# Patient Record
Sex: Male | Born: 1975 | Hispanic: Yes | Marital: Single
Health system: Southern US, Community
[De-identification: ages and names within clinical notes are randomized; demographics above are authoritative.]

---

## 2004-12-25 ENCOUNTER — Emergency Department: Payer: Self-pay | Admitting: Emergency Medicine

## 2005-01-25 ENCOUNTER — Emergency Department: Payer: Self-pay | Admitting: Emergency Medicine

## 2005-11-27 ENCOUNTER — Other Ambulatory Visit: Payer: Self-pay

## 2005-11-27 ENCOUNTER — Emergency Department: Payer: Self-pay | Admitting: Emergency Medicine

## 2006-05-12 ENCOUNTER — Emergency Department: Payer: Self-pay

## 2006-05-22 ENCOUNTER — Emergency Department: Payer: Self-pay | Admitting: General Practice

## 2006-05-23 ENCOUNTER — Emergency Department: Payer: Self-pay | Admitting: Emergency Medicine

## 2006-05-24 ENCOUNTER — Emergency Department: Payer: Self-pay | Admitting: Internal Medicine

## 2006-05-26 ENCOUNTER — Emergency Department: Payer: Self-pay | Admitting: Emergency Medicine

## 2006-06-26 ENCOUNTER — Emergency Department: Payer: Self-pay | Admitting: Emergency Medicine

## 2007-02-13 IMAGING — CR LEFT INDEX FINGER 2+V
1 series · 3 of 3 positions shown · non-contrast
Comparison: none

REASON FOR EXAM: laceration
COMMENTS:

PROCEDURE:     DXR - DXR FINGER INDEX 2ND DIGIT LT HA  - December 25, 2004  [DATE]
RESULT:
Three views of the LEFT index finger show no fracture, dislocation or other
acute bony abnormality.

[Series 296: postero_anterior · 0.11mm/px · 3 of 3 slices shown]
[im 1/3]
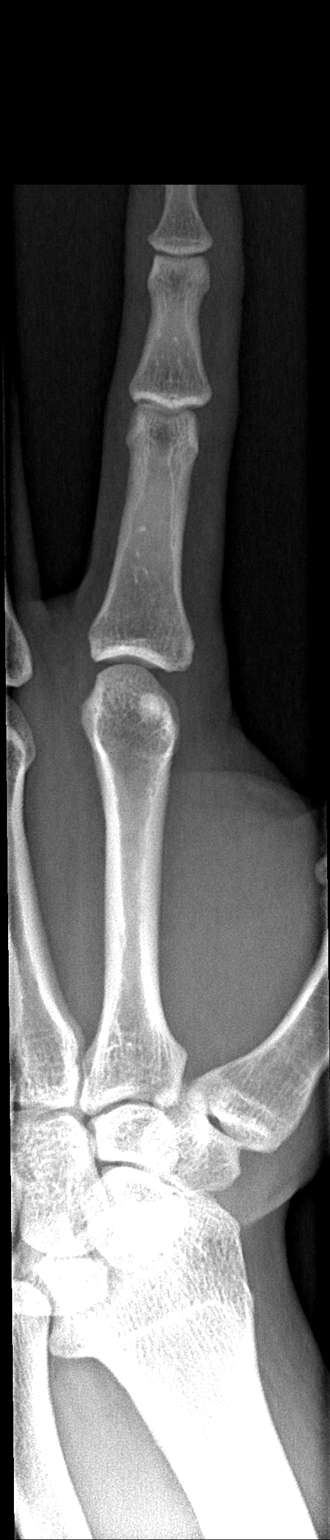
[im 2/3]
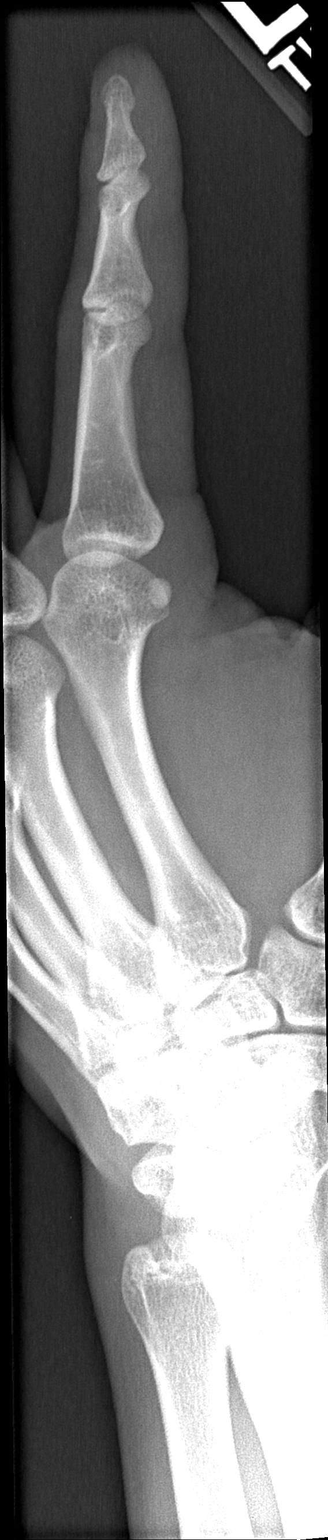
[im 3/3]
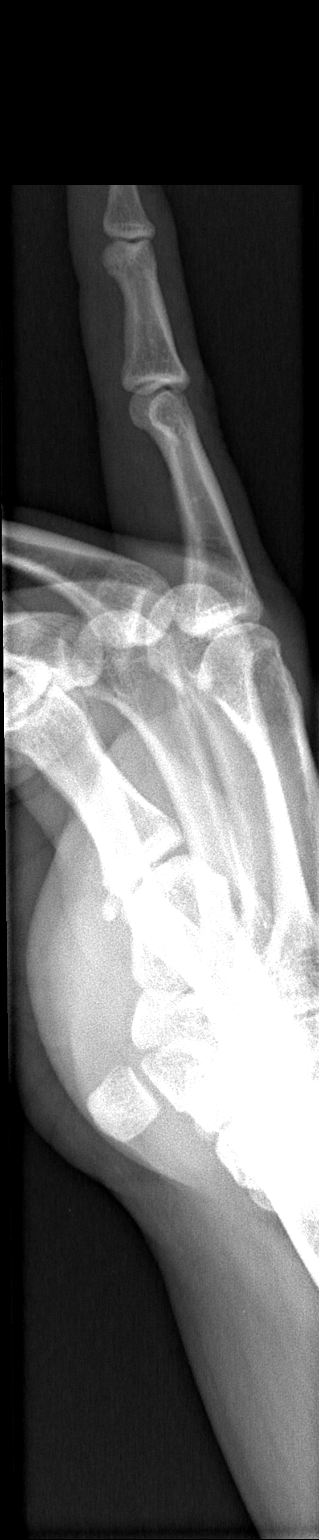

[3 of 3 positions shown; findings below may reference images not displayed]

IMPRESSION: 1)No acute bony abnormalities are seen.

2)No radiopaque soft tissue foreign body is observed.

## 2007-12-06 ENCOUNTER — Emergency Department: Payer: Self-pay | Admitting: Emergency Medicine

## 2007-12-06 ENCOUNTER — Other Ambulatory Visit: Payer: Self-pay

## 2008-05-20 ENCOUNTER — Emergency Department: Payer: Self-pay | Admitting: Emergency Medicine

## 2008-07-10 IMAGING — CR DG KNEE COMPLETE 4+V*R*
1 series · 4 of 4 positions shown · non-contrast
Comparison: none

REASON FOR EXAM: Pain, fever
COMMENTS:  LMP: (Male)

PROCEDURE:     DXR - DXR KNEE RT COMP WITH OBLIQUES  - May 22, 2006 [DATE]
RESULT:     No acute bony or joint abnormality is identified. No evidence of
fracture or dislocation.

[Series 1: view not recorded · 0.17mm/px · 4 of 4 slices shown]
[im 1/4]
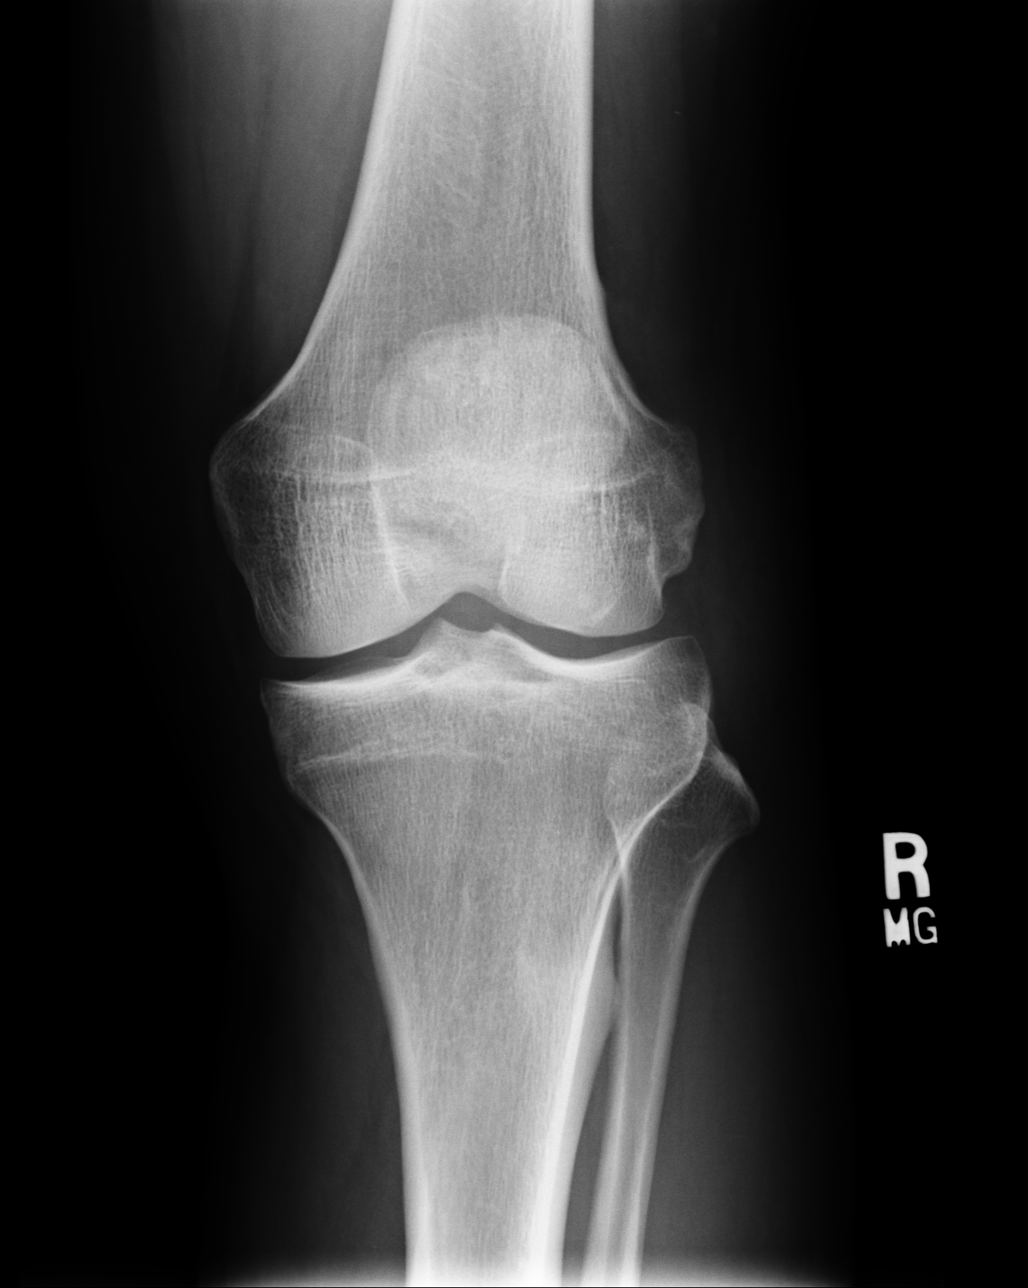
[im 2/4]
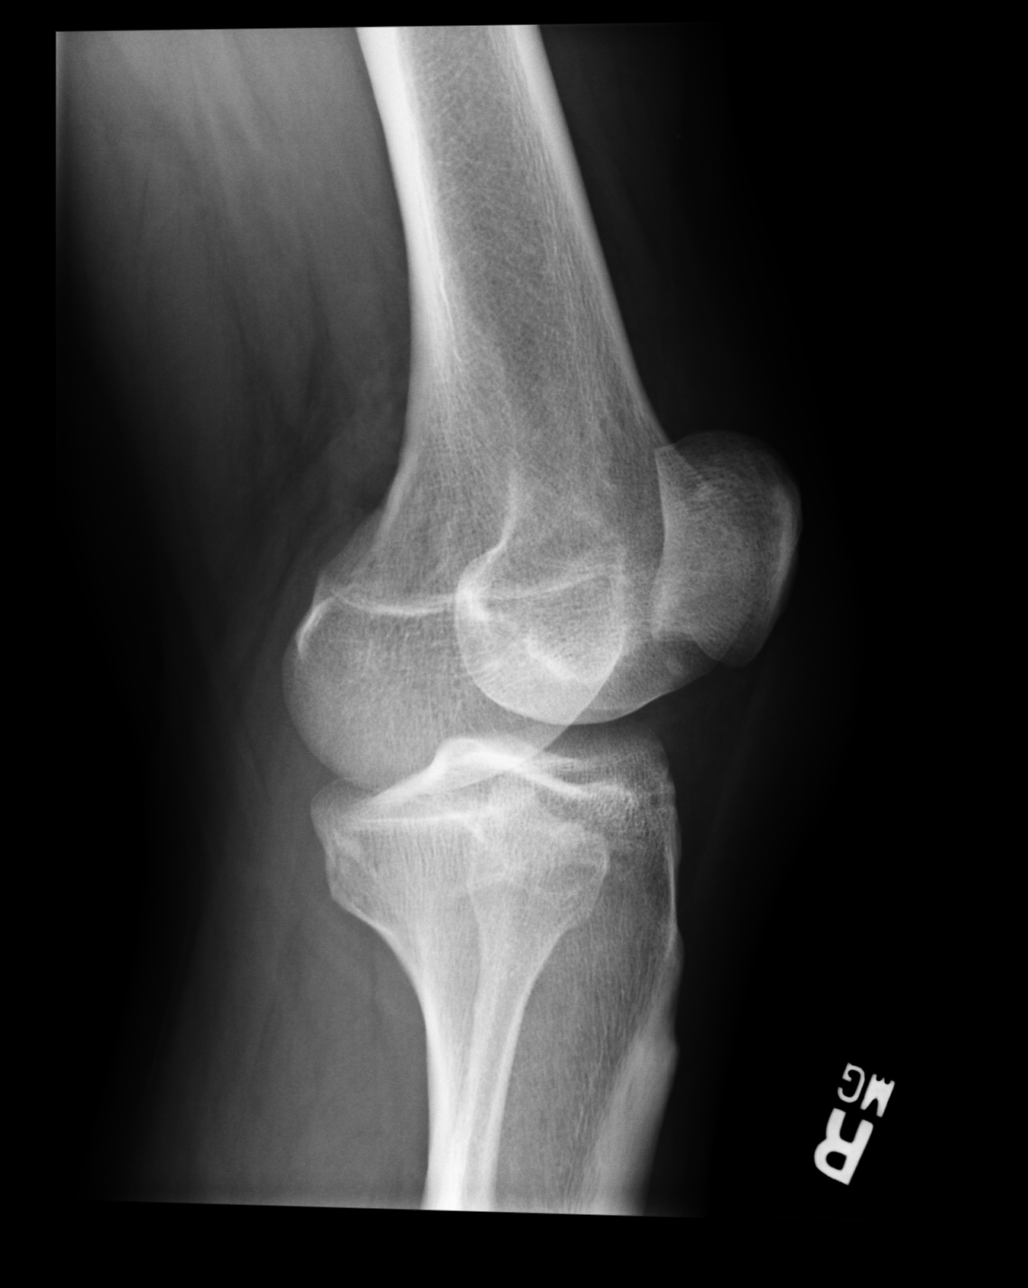
[im 3/4]
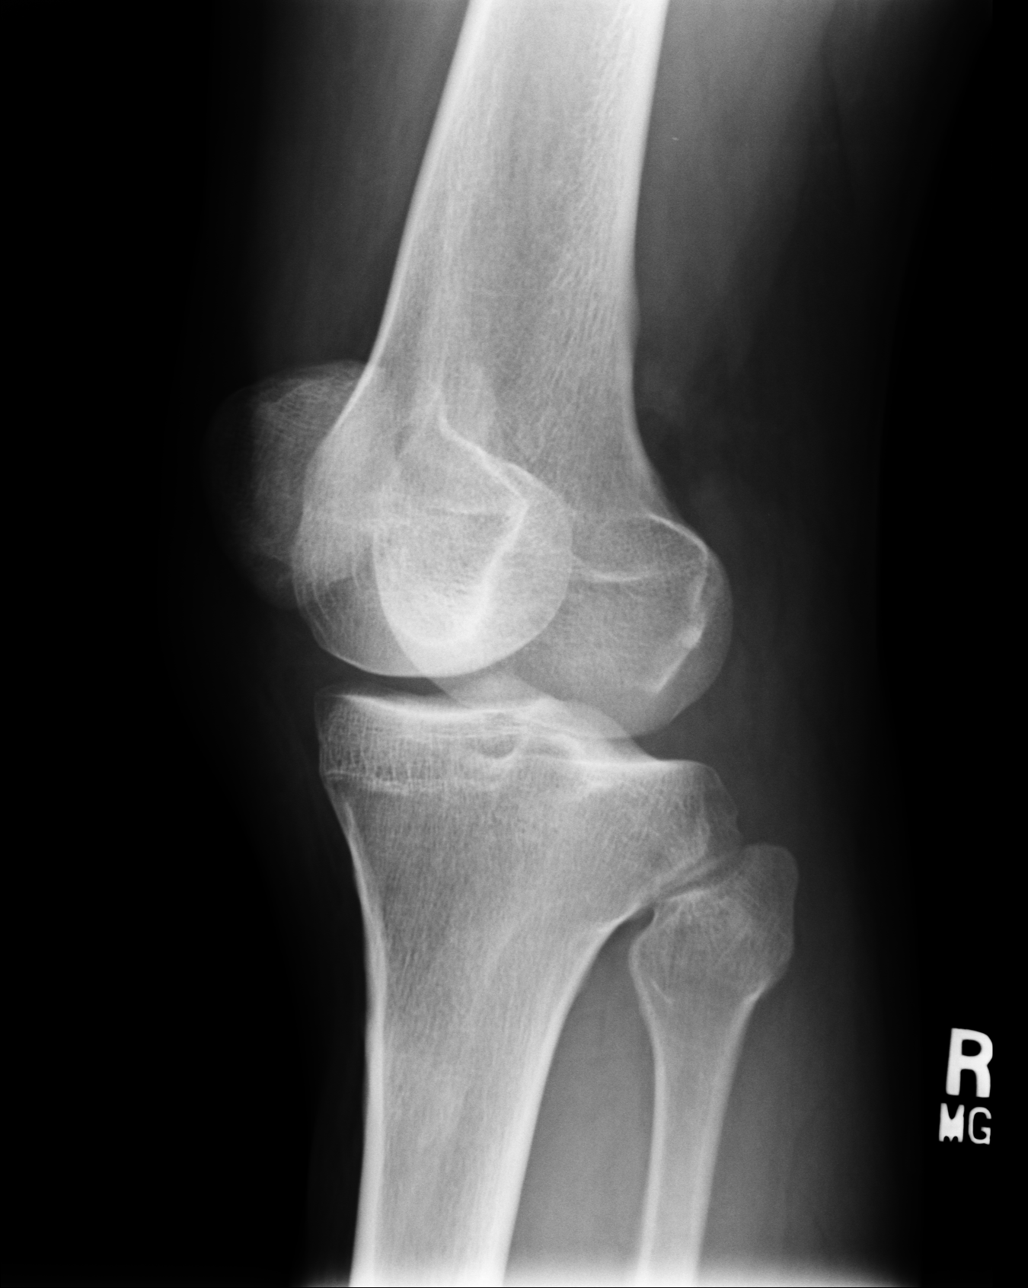
[im 4/4]
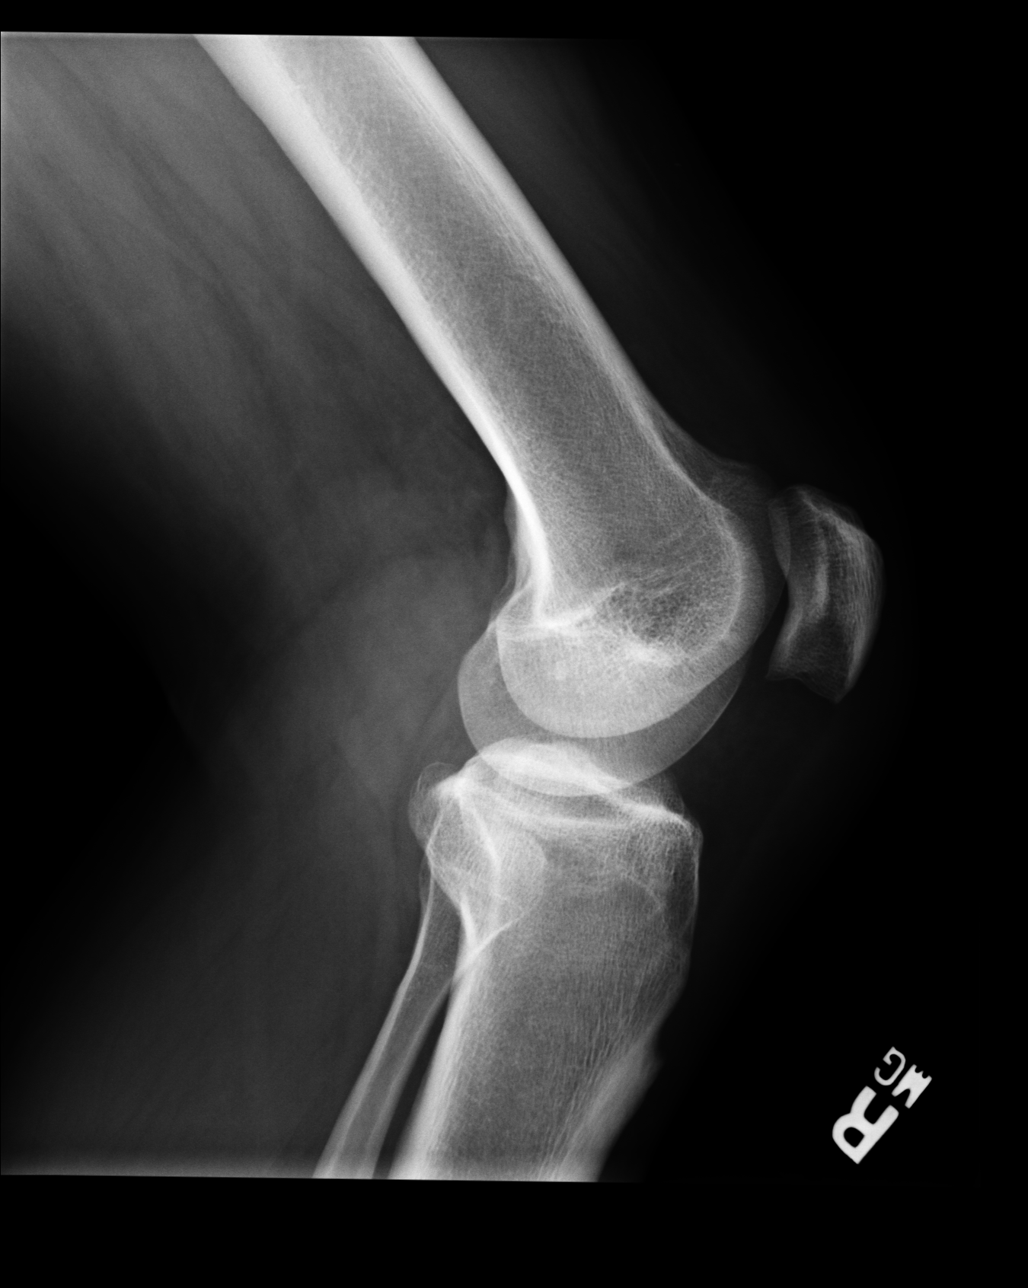

[4 of 4 positions shown; findings below may reference images not displayed]

IMPRESSION: No acute abnormality is identified.

## 2009-07-31 ENCOUNTER — Emergency Department: Payer: Self-pay | Admitting: Unknown Physician Specialty

## 2013-04-20 ENCOUNTER — Emergency Department: Payer: Self-pay | Admitting: Emergency Medicine

## 2013-04-21 LAB — URINALYSIS, COMPLETE
Bacteria: NONE SEEN
Glucose,UR: NEGATIVE mg/dL (ref 0–75)
Leukocyte Esterase: NEGATIVE
Nitrite: NEGATIVE
Ph: 6 (ref 4.5–8.0)
Specific Gravity: 1.009 (ref 1.003–1.030)
WBC UR: NONE SEEN /HPF (ref 0–5)

## 2013-04-21 LAB — COMPREHENSIVE METABOLIC PANEL
Albumin: 4 g/dL (ref 3.4–5.0)
Alkaline Phosphatase: 93 U/L (ref 50–136)
BUN: 10 mg/dL (ref 7–18)
Chloride: 105 mmol/L (ref 98–107)
EGFR (African American): >60
EGFR (Non-African Amer.): >60
Osmolality: 273 (ref 275–301)
SGOT(AST): 28 U/L (ref 15–37)

## 2013-04-21 LAB — CBC
HCT: 44.4 % (ref 40.0–52.0)
HGB: 15.5 g/dL (ref 13.0–18.0)
MCH: 30.6 pg (ref 26.0–34.0)
MCV: 87 fL (ref 80–100)
Platelet: 222 10*3/uL (ref 150–440)
WBC: 13.5 10*3/uL — ABNORMAL HIGH (ref 3.8–10.6)

## 2013-04-21 LAB — LIPASE, BLOOD: Lipase: 370 U/L (ref 73–393)

## 2016-01-31 ENCOUNTER — Ambulatory Visit
Admission: RE | Admit: 2016-01-31 | Discharge: 2016-01-31 | Disposition: A | Discharge status: Home / Self Care | Payer: Self-pay | Source: Ambulatory Visit | Attending: Family Medicine | Admitting: Family Medicine

## 2016-01-31 ENCOUNTER — Other Ambulatory Visit (HOSPITAL_COMMUNITY): Payer: Self-pay | Admitting: Family Medicine

## 2016-01-31 DIAGNOSIS — R7611 Nonspecific reaction to tuberculin skin test without active tuberculosis: Secondary | ICD-10-CM

## 2016-01-31 DIAGNOSIS — R7612 Nonspecific reaction to cell mediated immunity measurement of gamma interferon antigen response without active tuberculosis: Secondary | ICD-10-CM | POA: Insufficient documentation

## 2019-10-22 ENCOUNTER — Ambulatory Visit: Payer: Self-pay | Attending: Internal Medicine

## 2019-10-22 DIAGNOSIS — Z23 Encounter for immunization: Secondary | ICD-10-CM

## 2019-10-22 NOTE — Progress Notes (Signed)
   [facility] Vaccination [facility]  Name:  James Figueroa    MRN: [id number] DOB: 09-13-1975  10/22/2019  Mr. James Figueroa was observed post Covid-19 immunization for 15 minutes without incident. He was provided with Vaccine Information Sheet and instruction to access the V-Safe system.   Mr. James Figueroa was instructed to call 911 with any severe reactions post vaccine: Marland Kitchen Difficulty breathing  . Swelling of face and throat  . A fast heartbeat  . A bad rash all over body  . Dizziness and weakness   Immunizations Administered    Name Date Dose VIS Date Route   Pfizer COVID-19 Vaccine 10/22/2019  4:09 PM 0.3 mL 07/02/2019 Intramuscular   Manufacturer: [employer], [employer]   Lot: [id number]   NDC: [address]-2

## 2019-11-12 ENCOUNTER — Ambulatory Visit: Payer: Self-pay | Attending: Internal Medicine

## 2019-11-12 DIAGNOSIS — Z23 Encounter for immunization: Secondary | ICD-10-CM

## 2019-11-12 NOTE — Progress Notes (Signed)
   Covid-[facility] Vaccination [facility]  Name:  James Figueroa    MRN: [id number] DOB: 11/11/1975  11/12/2019  Mr. James Figueroa was observed post Covid-19 immunization for 15 minutes without incident. He was provided with Vaccine Information Sheet and instruction to access the V-Safe system.   Mr. James Figueroa was instructed to call 911 with any severe reactions post vaccine: Marland Kitchen Difficulty breathing  . Swelling of face and throat  . A fast heartbeat  . A bad rash all over body  . Dizziness and weakness   Immunizations Administered    Name Date Dose VIS Date Route   Pfizer COVID-19 Vaccine 11/12/2019 12:32 PM 0.3 mL 09/15/2018 Intramuscular   Manufacturer: [employer], [employer]   Lot: [id number]   NDC: [address]-9
